# Patient Record
Sex: Male | Born: 1975 | Hispanic: No | Marital: Single | Smoking: Never smoker
Health system: Southern US, Community
[De-identification: ages and names within clinical notes are randomized; demographics above are authoritative.]

---

## 2000-04-30 ENCOUNTER — Emergency Department (HOSPITAL_COMMUNITY): Admission: EM | Admit: 2000-04-30 | Discharge: 2000-04-30 | Payer: Self-pay | Admitting: Emergency Medicine

## 2000-04-30 ENCOUNTER — Encounter: Payer: Self-pay | Admitting: Emergency Medicine

## 2014-07-13 ENCOUNTER — Emergency Department (HOSPITAL_COMMUNITY)
Admission: EM | Admit: 2014-07-13 | Discharge: 2014-07-13 | Disposition: A | Discharge status: Home / Self Care | Payer: Self-pay | Attending: Emergency Medicine | Admitting: Emergency Medicine

## 2014-07-13 ENCOUNTER — Encounter (HOSPITAL_COMMUNITY): Payer: Self-pay

## 2014-07-13 DIAGNOSIS — L282 Other prurigo: Secondary | ICD-10-CM

## 2014-07-13 DIAGNOSIS — N368 Other specified disorders of urethra: Secondary | ICD-10-CM

## 2014-07-13 DIAGNOSIS — R300 Dysuria: Secondary | ICD-10-CM

## 2014-07-13 DIAGNOSIS — Z8619 Personal history of other infectious and parasitic diseases: Secondary | ICD-10-CM | POA: Insufficient documentation

## 2014-07-13 DIAGNOSIS — R0602 Shortness of breath: Secondary | ICD-10-CM | POA: Insufficient documentation

## 2014-07-13 DIAGNOSIS — N342 Other urethritis: Secondary | ICD-10-CM | POA: Insufficient documentation

## 2014-07-13 DIAGNOSIS — L299 Pruritus, unspecified: Secondary | ICD-10-CM | POA: Insufficient documentation

## 2014-07-13 LAB — URINALYSIS, ROUTINE W REFLEX MICROSCOPIC
Bilirubin Urine: NEGATIVE
Glucose, UA: NEGATIVE mg/dL
Hgb urine dipstick: NEGATIVE
Ketones, ur: NEGATIVE mg/dL
Leukocytes, UA: NEGATIVE
Nitrite: NEGATIVE
Protein, ur: NEGATIVE mg/dL
Specific Gravity, Urine: 1.006 (ref 1.005–1.030)
Urobilinogen, UA: 0.2 mg/dL (ref 0.0–1.0)
pH: 6.5 (ref 5.0–8.0)

## 2014-07-13 MED ORDER — DIPHENHYDRAMINE HCL 25 MG PO TABS: 25.0000 mg | ORAL_TABLET | Freq: Four times a day (QID) | ORAL | Status: AC

## 2014-07-13 MED ORDER — TRAMADOL HCL 50 MG PO TABS: 50.0000 mg | ORAL_TABLET | Freq: Four times a day (QID) | ORAL | Status: AC | PRN

## 2014-07-13 NOTE — ED Provider Notes (Signed)
CSN: [id number]     Arrival date & time 07/13/14  1823 History  This chart was scribed for non-physician practitioner, Junius FinnerErin O'Malley, PA-C,working with Arby BarretteMarcy Pfeiffer, MD, by Karle PlumberJennifer Tensley, ED Scribe. This patient was seen in room WTR9/WTR9 and the patient's care was started at 7:01 PM.  Chief Complaint  Patient presents with  . Allergic Reaction   Patient is a 38 y.o. male presenting with allergic reaction. The history is provided by the patient. No language interpreter was used.  Allergic Reaction Presenting symptoms: rash     HPI Comments:  Jeffrey PartridgeSergio C Duncan is a 38 y.o. male who presents to the Emergency Department complaining of an itching rash to his back and face that began today. He reports feeling as if he was having difficulty breathing earlier today, frequency of urination and dysuria for the past two days as well. He reports he was recently treated for gonorrhea and chlamydia with antibiotic injection, Azithromycin tablets, Doxycycline tablets and Cefixime tablets that he finished 7 days ago. He reports the results of his STD test fore GC/chlamydia was negative. He also reports  Pt reports that he has been having unprotected sex. He also reports a genital sore located on his penis that he states is painful. When he was checked for STDs at the urgent care he previously visited he reports the doctor was not concerned with the lesion. He denies being tested for Syphilis or Herpes. Denies hematuria, fever, chills, nausea or vomiting.   History reviewed. No pertinent past medical history. History reviewed. No pertinent past surgical history. No family history on file. History  Substance Use Topics  . Smoking status: Never Smoker   . Smokeless tobacco: Not on file  . Alcohol Use: No    Review of Systems  Constitutional: Negative for fever and chills.  Respiratory: Positive for shortness of breath.   Gastrointestinal: Negative for nausea and vomiting.  Genitourinary: Positive for  dysuria and genital sores. Negative for hematuria.  Skin: Positive for rash.  All other systems reviewed and are negative.   Allergies  Review of patient's allergies indicates no known allergies.  Home Medications   Prior to Admission medications   Medication Sig Start Date End Date Taking? Authorizing Provider  diphenhydrAMINE (BENADRYL) 25 MG tablet Take 1 tablet (25 mg total) by mouth every 6 (six) hours. 07/13/14   Junius FinnerErin O'Malley, PA-C  traMADol (ULTRAM) 50 MG tablet Take 1 tablet (50 mg total) by mouth every 6 (six) hours as needed. 07/13/14   Junius FinnerErin O'Malley, PA-C   Triage Vitals: BP 133/85 mmHg  Pulse 75  Temp(Src) 97.9 F (36.6 C) (Oral)  Resp 22  SpO2 98% Physical Exam  Constitutional: He is oriented to person, place, and time. He appears well-developed and well-nourished.  HENT:  Head: Normocephalic and atraumatic.  Eyes: EOM are normal.  Neck: Normal range of motion.  Cardiovascular: Normal rate.   Pulmonary/Chest: Effort normal.  Genitourinary: Uncircumcised. Penile erythema present. No phimosis or paraphimosis. No discharge found.  Lesion located on inside of urinary meatus.  Musculoskeletal: Normal range of motion.  Neurological: He is alert and oriented to person, place, and time.  Skin: Skin is warm and dry. Rash noted.  1 x 2 cm area of patchy, erythematous rash on left deltoid. No red streaking, induration or fluctuance. Diffused area of patchy, erythematous rash on the mid to lower back. No red streaking, induration or fluctuance. No rash on oral mucosa, palms or soles of feet.  Psychiatric: He has a  normal mood and affect. His behavior is normal.  Nursing note and vitals reviewed.   ED Course  Procedures (including critical care time) DIAGNOSTIC STUDIES: Oxygen Saturation is 98% on RA, normal by my interpretation.   COORDINATION OF CARE: 7:12 PM- Will order urinalysis and STD panel. Pt verbalizes understanding and agrees to plan.  Medications - No  data to display  Labs Review Labs Reviewed  GC/CHLAMYDIA PROBE AMP  URINE CULTURE  HERPES SIMPLEX VIRUS CULTURE  HIV ANTIBODY (ROUTINE TESTING)  RPR  URINALYSIS, ROUTINE W REFLEX MICROSCOPIC  HSV 1 ANTIBODY, IGG  HSV 2 ANTIBODY, IGG    Imaging Review No results found.   EKG Interpretation None      MDM   Final diagnoses:  Urethral meatus pain  Dysuria  Ulcer of urethral meatus  Pruritic rash    Pt c/o penile pain with dysuria. Reports having had unprotected sex w/o specific diagnosis of STD. States his PCP checked for GC/chlamydia but reports tests were negative. Pt was not tested for other STDs. On exam, ulceration on inside of urethral meatus. Concern for HSV or syphilis.  Will check for HIV, syphilis, herpes as well as recheck GC/chlamydia.  As pt was already on azithromycin, doxycycline and received "a shot" most likely rocephin, will not restart pt on antibiotics until test results come back.    Pt also concerned for allergic reaction to his medications, however, medications were stopped 7 days ago.  No respiratory distress on exam. Rash not c/w urticaria.  Will tx with benadryl.  Home care instructions provided. Return precautions provided. Pt verbalized understanding and agreement with tx plan.    I personally performed the services described in this documentation, which was scribed in my presence. The recorded information has been reviewed and is accurate.    Junius Finnerrin O'Malley, PA-C 07/13/14 1956  Arby BarretteMarcy Pfeiffer, MD 07/14/14 [phone]

## 2014-07-13 NOTE — ED Notes (Signed)
Pt presents with c/o possible allergic reaction. Pt reports he started taking medication for an STD infection approx 7 days ago and today he started feeling like he was having a hard time breathing and itching all over his body. Pt is in no distress, talking in complete sentences, ambulatory to triage. Pt also reports his infection is not getting any better.

## 2014-07-14 LAB — GC/CHLAMYDIA PROBE AMP
CT Probe RNA: NEGATIVE
GC Probe RNA: NEGATIVE

## 2014-07-14 LAB — HSV 2 ANTIBODY, IGG: HSV 2 Glycoprotein G Ab, IgG: <0.1 IV

## 2014-07-14 LAB — HIV ANTIBODY (ROUTINE TESTING W REFLEX): HIV 1&2 Ab, 4th Generation: NONREACTIVE

## 2014-07-14 LAB — HSV 1 ANTIBODY, IGG: HSV 1 Glycoprotein G Ab, IgG: 11.88 IV — ABNORMAL HIGH

## 2014-07-14 LAB — RPR

## 2014-07-17 LAB — URINE CULTURE
Colony Count: NO GROWTH
Culture: NO GROWTH
Special Requests: NORMAL

## 2014-07-21 ENCOUNTER — Emergency Department (HOSPITAL_COMMUNITY): Payer: Self-pay

## 2014-07-21 ENCOUNTER — Emergency Department (HOSPITAL_COMMUNITY)
Admission: EM | Admit: 2014-07-21 | Discharge: 2014-07-22 | Disposition: A | Discharge status: Home / Self Care | Payer: Self-pay | Attending: Emergency Medicine | Admitting: Emergency Medicine

## 2014-07-21 ENCOUNTER — Encounter (HOSPITAL_COMMUNITY): Payer: Self-pay | Admitting: Emergency Medicine

## 2014-07-21 DIAGNOSIS — R109 Unspecified abdominal pain: Secondary | ICD-10-CM

## 2014-07-21 DIAGNOSIS — A6000 Herpesviral infection of urogenital system, unspecified: Secondary | ICD-10-CM

## 2014-07-21 DIAGNOSIS — A6002 Herpesviral infection of other male genital organs: Secondary | ICD-10-CM | POA: Insufficient documentation

## 2014-07-21 LAB — CBC WITH DIFFERENTIAL/PLATELET
Basophils Absolute: 0 10*3/uL (ref 0.0–0.1)
Basophils Relative: 0 % (ref 0–1)
Eosinophils Absolute: 0.2 10*3/uL (ref 0.0–0.7)
Eosinophils Relative: 3 % (ref 0–5)
HCT: 42.7 % (ref 39.0–52.0)
Hemoglobin: 14.6 g/dL (ref 13.0–17.0)
Lymphocytes Relative: 29 % (ref 12–46)
Lymphs Abs: 2.3 10*3/uL (ref 0.7–4.0)
MCH: 29 pg (ref 26.0–34.0)
MCHC: 34.2 g/dL (ref 30.0–36.0)
MCV: 84.7 fL (ref 78.0–100.0)
Monocytes Absolute: 0.9 10*3/uL (ref 0.1–1.0)
Monocytes Relative: 10 % (ref 3–12)
Neutro Abs: 4.8 10*3/uL (ref 1.7–7.7)
Neutrophils Relative %: 58 % (ref 43–77)
Platelets: 188 10*3/uL (ref 150–400)
RBC: 5.04 MIL/uL (ref 4.22–5.81)
RDW: 12 % (ref 11.5–15.5)
WBC: 8.2 10*3/uL (ref 4.0–10.5)

## 2014-07-21 LAB — URINALYSIS, ROUTINE W REFLEX MICROSCOPIC
BILIRUBIN URINE: NEGATIVE
GLUCOSE, UA: NEGATIVE mg/dL
HGB URINE DIPSTICK: NEGATIVE
Ketones, ur: NEGATIVE mg/dL
Leukocytes, UA: NEGATIVE
NITRITE: NEGATIVE
Protein, ur: NEGATIVE mg/dL
Specific Gravity, Urine: 1.023 (ref 1.005–1.030)
Urobilinogen, UA: 0.2 mg/dL (ref 0.0–1.0)
pH: 6 (ref 5.0–8.0)

## 2014-07-21 LAB — BASIC METABOLIC PANEL
Anion gap: 8 (ref 5–15)
BUN: 13 mg/dL (ref 6–23)
CO2: 26 mmol/L (ref 19–32)
Calcium: 8.9 mg/dL (ref 8.4–10.5)
Chloride: 105 mEq/L (ref 96–112)
Creatinine, Ser: 0.93 mg/dL (ref 0.50–1.35)
GFR calc Af Amer: >90 mL/min (ref >90)
GFR calc non Af Amer: >90 mL/min (ref >90)
Glucose, Bld: 97 mg/dL (ref 70–99)
Potassium: 3.8 mmol/L (ref 3.5–5.1)
Sodium: 139 mmol/L (ref 135–145)

## 2014-07-21 NOTE — ED Notes (Signed)
Pt c/o L flank pain and burning upon urination x 3 weeks. Pt Sts he saw his PCP 3 weeks ago for an STD check. He was given antibiotics but got back a negative STD result. Pt sts ever since he has had "burning" in his penis that has now progressed to his L flank. Pt A&Ox4. Ambulatory to triage.

## 2014-07-22 MED ORDER — HYDROCODONE-ACETAMINOPHEN 5-325 MG PO TABS: 1.0000 | ORAL_TABLET | Freq: Four times a day (QID) | ORAL | Status: AC | PRN

## 2014-07-22 MED ORDER — ACYCLOVIR 800 MG PO TABS: 800.0000 mg | ORAL_TABLET | Freq: Four times a day (QID) | ORAL | Status: AC

## 2014-07-22 NOTE — ED Notes (Signed)
Pt alert, oriented, and ambulatory upon DC. Pt is aware of need for  following up with PCP and to notify partner of STD status.

## 2014-07-22 NOTE — Discharge Instructions (Signed)
Return here as needed. Follow up with your doctor. You will need to use condoms during sexual contact. I would also inform your partner of the diagnosis.

## 2014-07-22 NOTE — ED Provider Notes (Signed)
CSN: [id number]     Arrival date & time 07/21/14  1956 History   First MD Initiated Contact with Patient 07/21/14 2236     Chief Complaint  Patient presents with  . Flank Pain  . Dysuria     (Consider location/radiation/quality/duration/timing/severity/associated sxs/prior Treatment) HPI Patient presents to the emergency department with lower abdominal pain and burning pain that occurs in the tip of his penis and radiates into the lower portion of his abdomen.  Patient states is been ongoing for 3 weeks.  He states he was seen here previously and had negative gonorrhea and chlamydia testing, but did not receive any results about the herpes testing.  Patient denies nausea, vomiting, diarrhea, weakness, fever, headache, blurred vision, chest pain, shortness of breath, hematuria, back pain or syncope.        History reviewed. No pertinent past medical history. History reviewed. No pertinent past surgical history. No family history on file. History  Substance Use Topics  . Smoking status: Never Smoker   . Smokeless tobacco: Not on file  . Alcohol Use: No    Review of Systems All other systems negative except as documented in the HPI. All pertinent positives and negatives as reviewed in the HPI.    Allergies  Review of patient's allergies indicates no known allergies.  Home Medications   Prior to Admission medications   Medication Sig Start Date End Date Taking? Authorizing Provider  diphenhydrAMINE (BENADRYL) 25 MG tablet Take 1 tablet (25 mg total) by mouth every 6 (six) hours. 07/13/14  Yes Junius Finner, PA-C  traMADol (ULTRAM) 50 MG tablet Take 1 tablet (50 mg total) by mouth every 6 (six) hours as needed. Patient not taking: Reported on 07/21/2014 07/13/14   Junius Finner, PA-C   BP 133/71 mmHg  Pulse 64  Temp(Src) 98.4 F (36.9 C) (Oral)  Resp 16  SpO2 97% Physical Exam  Constitutional: He is oriented to person, place, and time. He appears well-developed and  well-nourished. No distress.  HENT:  Head: Normocephalic and atraumatic.  Eyes: Pupils are equal, round, and reactive to light.  Neck: Normal range of motion. Neck supple.  Cardiovascular: Normal rate, regular rhythm and normal heart sounds.  Exam reveals no gallop and no friction rub.   No murmur heard. Pulmonary/Chest: Effort normal and breath sounds normal. No respiratory distress.  Abdominal: Soft. Bowel sounds are normal. He exhibits no distension. There is no tenderness. There is no rebound and no guarding.  Neurological: He is alert and oriented to person, place, and time. He exhibits normal muscle tone. Coordination normal.  Skin: Skin is warm and dry. No rash noted. No erythema.  Nursing note and vitals reviewed.   ED Course  Procedures (including critical care time) Labs Review Labs Reviewed  URINALYSIS, ROUTINE W REFLEX MICROSCOPIC  CBC WITH DIFFERENTIAL  BASIC METABOLIC PANEL    Imaging Review Ct Renal Stone Study  07/21/2014   CLINICAL DATA:  Subacute onset of left flank pain for 2 weeks. Initial encounter.  EXAM: CT ABDOMEN AND PELVIS WITHOUT CONTRAST  TECHNIQUE: Multidetector CT imaging of the abdomen and pelvis was performed following the standard protocol without IV contrast.  COMPARISON:  None.  FINDINGS: The visualized lung bases are clear.  The liver and spleen are unremarkable in appearance. The gallbladder is within normal limits. The pancreas and adrenal glands are unremarkable.  The kidneys are unremarkable in appearance. There is no evidence of hydronephrosis. No renal or ureteral stones are seen. No perinephric stranding is appreciated.  No free fluid is identified. The small bowel is unremarkable in appearance. The stomach is within normal limits. No acute vascular abnormalities are seen.  The appendix is normal in caliber, without evidence for appendicitis. The colon is unremarkable in appearance.  The bladder is mildly distended and grossly unremarkable. A small  urachal remnant is incidentally seen. The prostate remains normal in size. No inguinal lymphadenopathy is seen.  No acute osseous abnormalities are identified.  IMPRESSION: No acute abnormality seen within the abdomen or pelvis. No evidence of hydronephrosis.   Electronically Signed   By: Roanna RaiderJeffery  Chang M.D.   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 Will refer back to his PCP. Told to return here as needed.   MDM   Final diagnoses:  Flank pain        Carlyle DollyChristopher W Yanette Tripoli, PA-C 07/22/14 0103  Rolland PorterMark James, MD 08/01/14 2337

## 2014-07-26 ENCOUNTER — Ambulatory Visit: Payer: Self-pay | Admitting: Family Medicine

## 2015-03-23 IMAGING — CT CT RENAL STONE PROTOCOL
1 series · 15 of 25 positions shown, 19 images · non-contrast
Comparison: None.

CLINICAL DATA: Subacute onset of left flank pain for 2 weeks.
Initial encounter.

EXAM:
CT ABDOMEN AND PELVIS WITHOUT CONTRAST
TECHNIQUE: Multidetector CT imaging of the abdomen and pelvis was performed
following the standard protocol without IV contrast.

[Series 6: lung · axial · 0.73mm/px · z∈[+1221,+1331]mm · 15 of 25 slices shown, 19 images]
[im 2/25  soft-tissue]
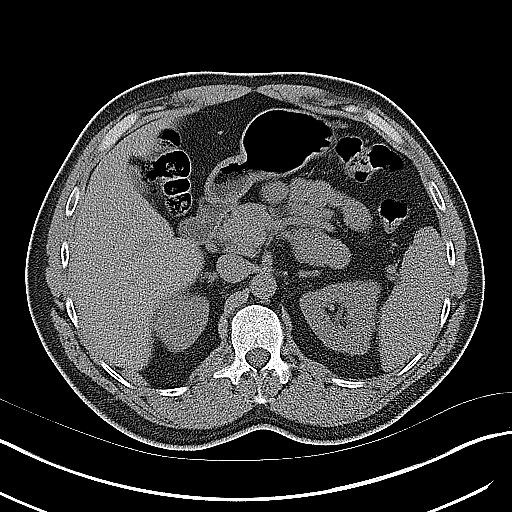
[im 2/25  bone]
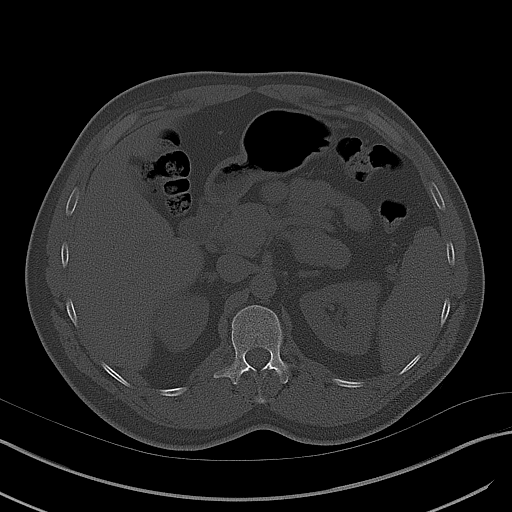
[im 4/25  soft-tissue]
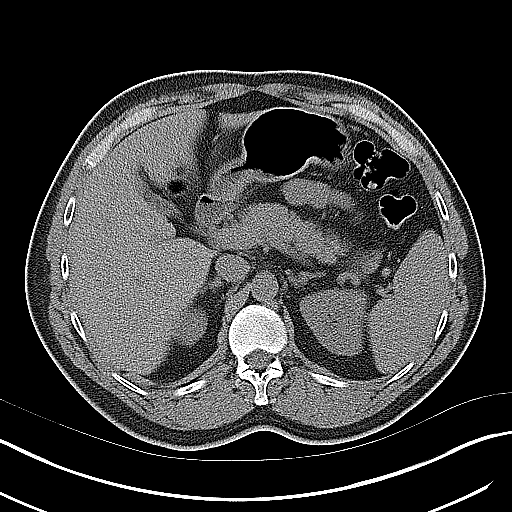
[im 6/25  soft-tissue]
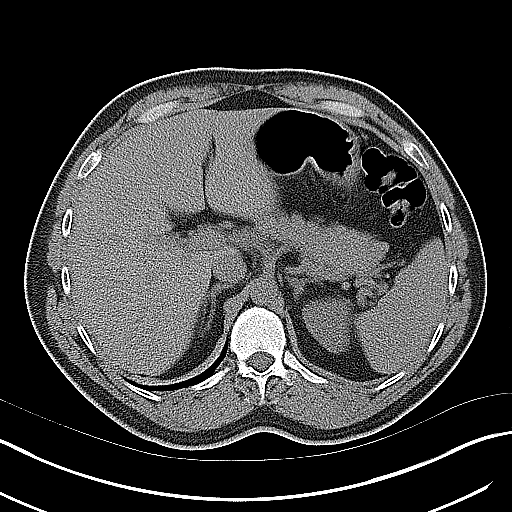
[im 8/25  soft-tissue]
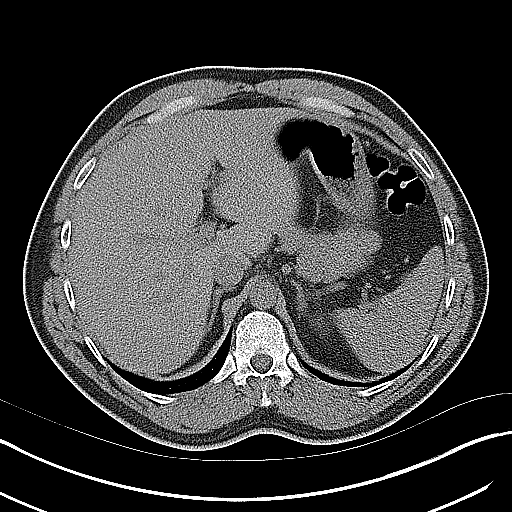
[im 9/25  soft-tissue]
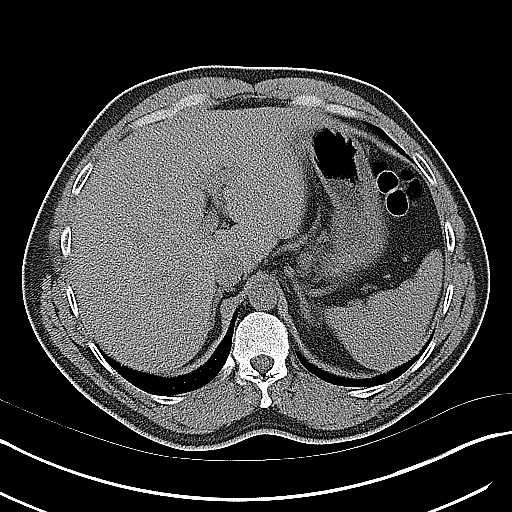
[im 11/25  soft-tissue]
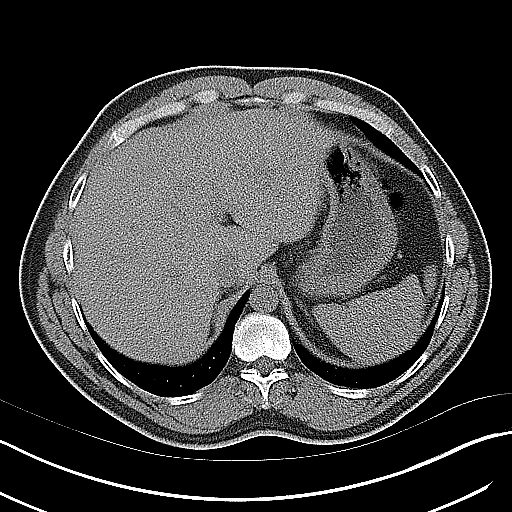
[im 13/25  soft-tissue]
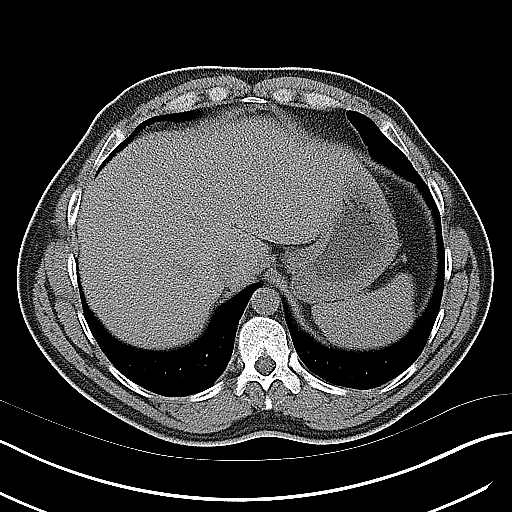
[im 15/25  soft-tissue]
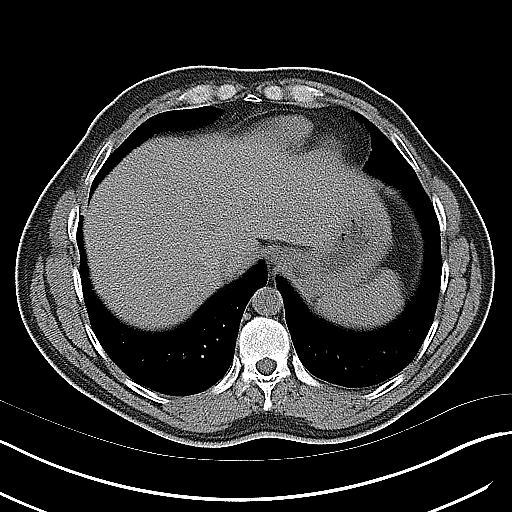
[im 17/25  soft-tissue]
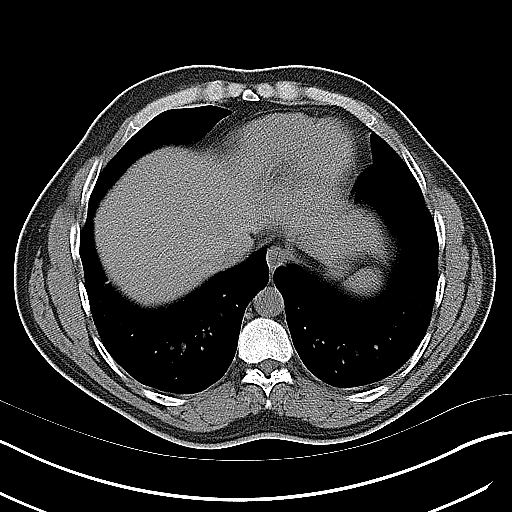
[im 17/25  bone]
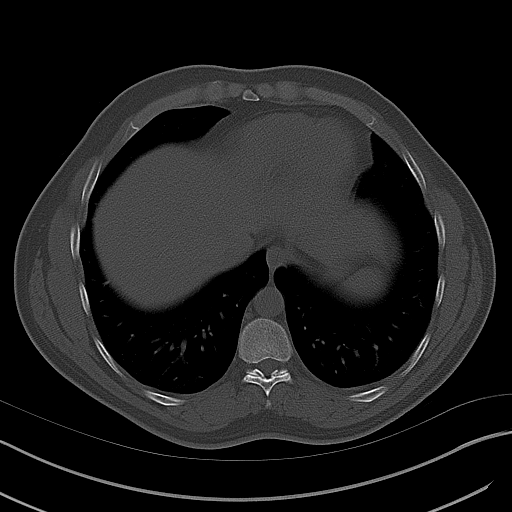
[im 18/25  soft-tissue]
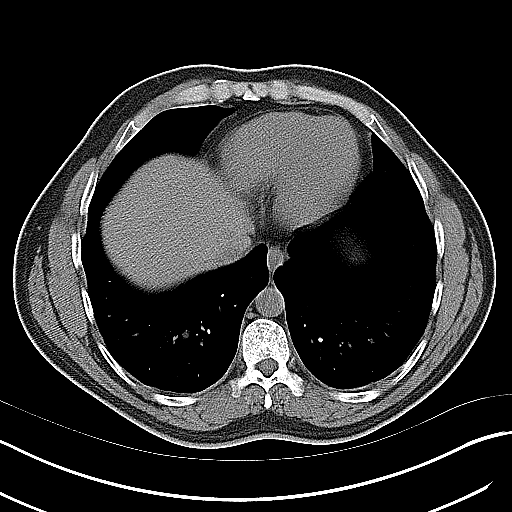
[im 20/25  soft-tissue]
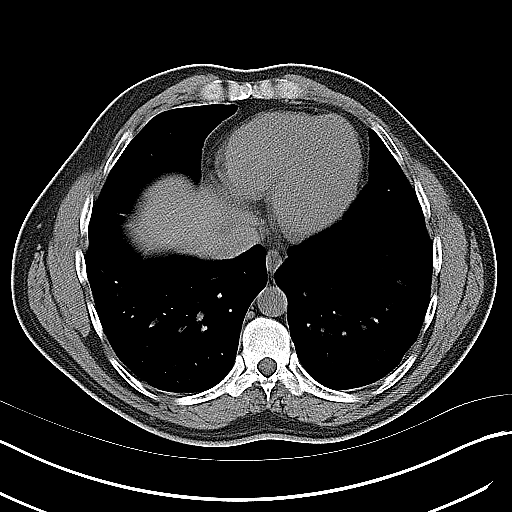
[im 21/25  lung]
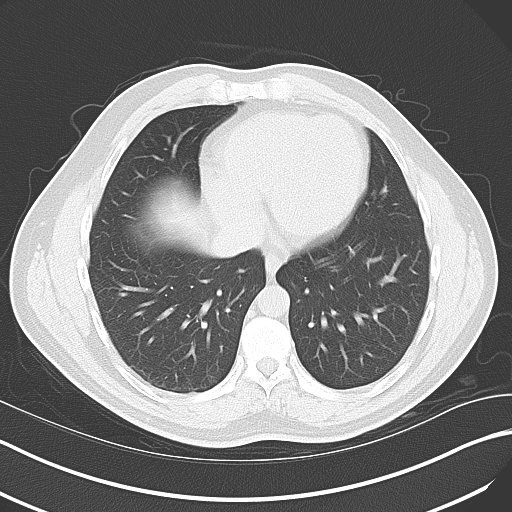
[im 22/25  soft-tissue]
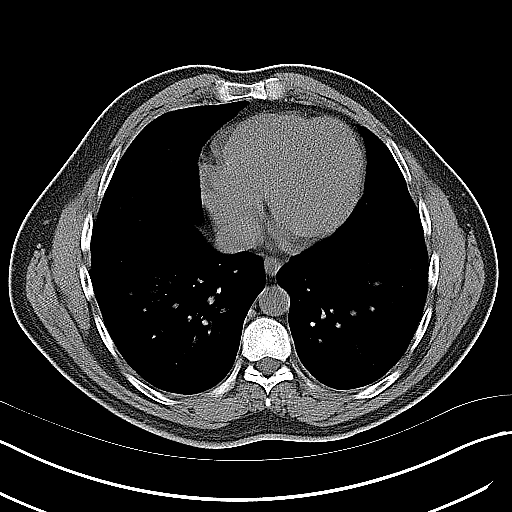
[im 22/25  lung]
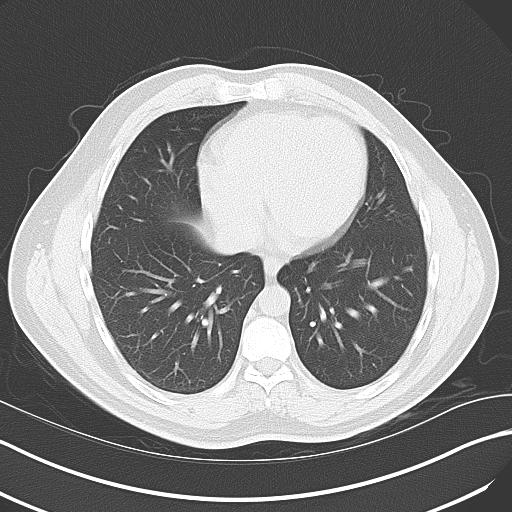
[im 23/25  lung]
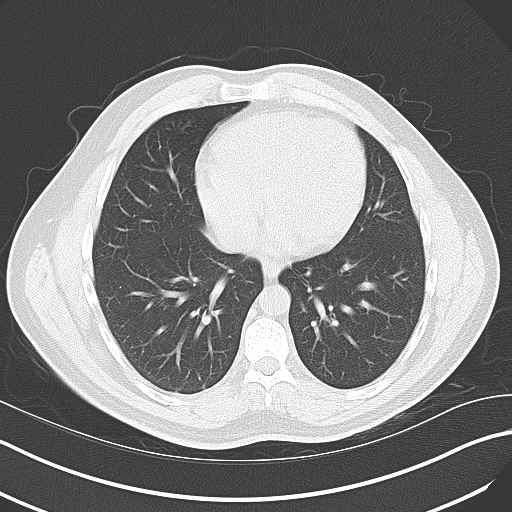
[im 24/25  soft-tissue]
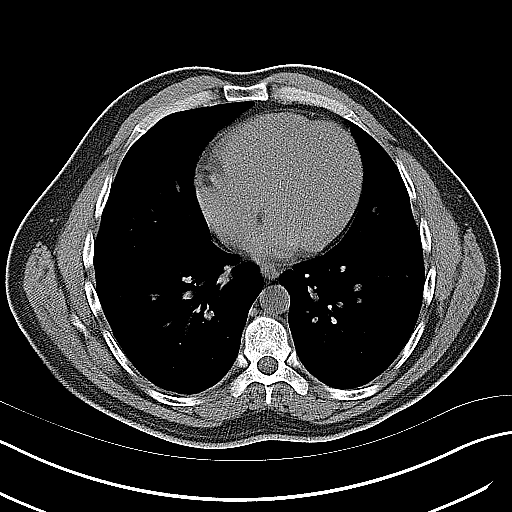
[im 24/25  lung]
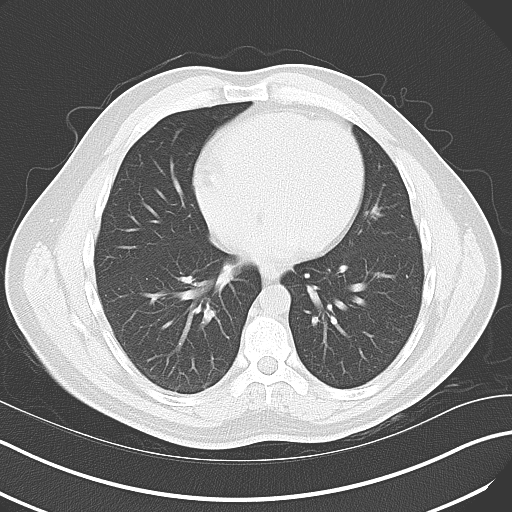

[15 of 25 positions shown; findings below may reference images not displayed]

FINDINGS: The visualized lung bases are clear.

The liver and spleen are unremarkable in appearance. The gallbladder
is within normal limits. The pancreas and adrenal glands are
unremarkable.

The kidneys are unremarkable in appearance. There is no evidence of
hydronephrosis. No renal or ureteral stones are seen. No perinephric
stranding is appreciated.

No free fluid is identified. The small bowel is unremarkable in
appearance. The stomach is within normal limits. No acute vascular
abnormalities are seen.

The appendix is normal in caliber, without evidence for
appendicitis. The colon is unremarkable in appearance.

The bladder is mildly distended and grossly unremarkable. A small
urachal remnant is incidentally seen. The prostate remains normal in
size. No inguinal lymphadenopathy is seen.

No acute osseous abnormalities are identified.
IMPRESSION: No acute abnormality seen within the abdomen or pelvis. No evidence
of hydronephrosis.
